# Patient Record
Sex: Male | Born: 1975 | Race: White | Hispanic: No | Marital: Single | Smoking: Current every day smoker
Health system: Southern US, Community
[De-identification: ages and names within clinical notes are randomized; demographics above are authoritative.]

## PROBLEM LIST (undated history)

## (undated) HISTORY — PX: OTHER SURGICAL HISTORY: SHX169

---

## 2013-04-27 ENCOUNTER — Emergency Department (HOSPITAL_COMMUNITY)
Admission: EM | Admit: 2013-04-27 | Discharge: 2013-04-27 | Disposition: A | Discharge status: Home / Self Care | Payer: Self-pay | Attending: Emergency Medicine | Admitting: Emergency Medicine

## 2013-04-27 ENCOUNTER — Encounter (HOSPITAL_COMMUNITY): Payer: Self-pay | Admitting: Emergency Medicine

## 2013-04-27 ENCOUNTER — Emergency Department (HOSPITAL_COMMUNITY): Payer: Self-pay

## 2013-04-27 DIAGNOSIS — F17200 Nicotine dependence, unspecified, uncomplicated: Secondary | ICD-10-CM | POA: Insufficient documentation

## 2013-04-27 DIAGNOSIS — M2550 Pain in unspecified joint: Secondary | ICD-10-CM | POA: Insufficient documentation

## 2013-04-27 DIAGNOSIS — R110 Nausea: Secondary | ICD-10-CM | POA: Insufficient documentation

## 2013-04-27 DIAGNOSIS — R52 Pain, unspecified: Secondary | ICD-10-CM | POA: Insufficient documentation

## 2013-04-27 DIAGNOSIS — J069 Acute upper respiratory infection, unspecified: Secondary | ICD-10-CM | POA: Insufficient documentation

## 2013-04-27 DIAGNOSIS — IMO0001 Reserved for inherently not codable concepts without codable children: Secondary | ICD-10-CM | POA: Insufficient documentation

## 2013-04-27 MED ORDER — IBUPROFEN 400 MG PO TABS
800.0000 mg | ORAL_TABLET | Freq: Once | ORAL | Status: AC
  Administered 2013-04-27: 800 mg via ORAL
  Filled 2013-04-27: qty 2

## 2013-04-27 NOTE — Discharge Instructions (Signed)
Rest and drink plenty of fluids  Try over the counter cough medicine and nasal decongestants to help your symptoms  Return to the ED if you have any worsening/changing condition, difficulty breathing, fever, repeated vomiting, coughing up blood, passing out, stiff neck, chest pain, or any other concerns (see below)    Upper Respiratory Infection, Adult An upper respiratory infection (URI) is also known as the common cold. It is often caused by a type of germ (virus). Colds are easily spread (contagious). You can pass it to others by kissing, coughing, sneezing, or drinking out of the same glass. Usually, you get better in 1 or 2 weeks.  HOME CARE   Only take medicine as told by your doctor.  Use a warm mist humidifier or breathe in steam from a hot shower.  Drink enough water and fluids to keep your pee (urine) clear or pale yellow.  Get plenty of rest.  Return to work when your temperature is back to normal or as told by your doctor. You may use a face mask and wash your hands to stop your cold from spreading. GET HELP RIGHT AWAY IF:   After the first few days, you feel you are getting worse.  You have questions about your medicine.  You have chills, shortness of breath, or brown or red spit (mucus).  You have yellow or brown snot (nasal discharge) or pain in the face, especially when you bend forward.  You have a fever, puffy (swollen) neck, pain when you swallow, or white spots in the back of your throat.  You have a bad headache, ear pain, sinus pain, or chest pain.  You have a high-pitched whistling sound when you breathe in and out (wheezing).  You have a lasting cough or cough up blood.  You have sore muscles or a stiff neck. MAKE SURE YOU:   Understand these instructions.  Will watch your condition.  Will get help right away if you are not doing well or get worse. Document Released: 09/24/2007 Document Revised: 06/30/2011 Document Reviewed: 08/12/2010 [facility]  Patient Information 2014 [place], [place].   Cough, Adult  A cough is a reflex that helps clear your throat and airways. It can help heal the body or may be a reaction to an irritated airway. A cough may only last 2 or 3 weeks (acute) or may last more than 8 weeks (chronic).  CAUSES Acute cough:  Viral or bacterial infections. Chronic cough:  Infections.  Allergies.  Asthma.  Post-nasal drip.  Smoking.  Heartburn or acid reflux.  Some medicines.  Chronic lung problems (COPD).  Cancer. SYMPTOMS   Cough.  Fever.  Chest pain.  Increased breathing rate.  High-pitched whistling sound when breathing (wheezing).  Colored mucus that you cough up (sputum). TREATMENT   A bacterial cough may be treated with antibiotic medicine.  A viral cough must run its course and will not respond to antibiotics.  Your caregiver may recommend other treatments if you have a chronic cough. HOME CARE INSTRUCTIONS   Only take over-the-counter or prescription medicines for pain, discomfort, or fever as directed by your caregiver. Use cough suppressants only as directed by your caregiver.  Use a cold steam vaporizer or humidifier in your bedroom or home to help loosen secretions.  Sleep in a semi-upright position if your cough is worse at night.  Rest as needed.  Stop smoking if you smoke. SEEK IMMEDIATE MEDICAL CARE IF:   You have pus in your sputum.  Your cough starts  to worsen.  You cannot control your cough with suppressants and are losing sleep.  You begin coughing up blood.  You have difficulty breathing.  You develop pain which is getting worse or is uncontrolled with medicine.  You have a fever. MAKE SURE YOU:   Understand these instructions.  Will watch your condition.  Will get help right away if you are not doing well or get worse. Document Released: 10/04/2010 Document Revised: 06/30/2011 Document Reviewed: 10/04/2010 [facility] Patient Information 2014  [place], [place].   Emergency Department Resource Guide 1) Find a Doctor and Pay Out of Pocket Although you won't have to find out who is covered by your insurance plan, it is a good idea to ask around and get recommendations. You will then need to call the office and see if the doctor you have chosen will accept you as a new patient and what types of options they offer for patients who are self-pay. Some doctors offer discounts or will set up payment plans for their patients who do not have insurance, but you will need to ask so you aren't surprised when you get to your appointment.  2) Contact Your Local Health Department Not all health departments have doctors that can see patients for sick visits, but many do, so it is worth a call to see if yours does. If you don't know where your local health department is, you can check in your phone book. The CDC also has a tool to help you locate your state's health department, and many state websites also have listings of all of their local health departments.  3) Find a Walk-in Clinic If your illness is not likely to be very severe or complicated, you may want to try a walk in clinic. These are popping up all over the country in pharmacies, drugstores, and shopping centers. They're usually staffed by nurse practitioners or physician assistants that have been trained to treat common illnesses and complaints. They're usually fairly quick and inexpensive. However, if you have serious medical issues or chronic medical problems, these are probably not your best option.  No Primary Care Doctor: - Call Health Connect at  [phone] - they can help you locate a primary care doctor that  accepts your insurance, provides certain services, etc. - Physician Referral Service- [phone]  Chronic Pain Problems: Organization         Address  Phone   Notes  [facility]  [phone] Patients need to be referred by their primary care doctor.    Medication Assistance: Organization         Address  Phone   Notes  [facility] [address] [place] [place], [place] [address] [phone] --Must be a resident of [facility] -- Must have NO insurance coverage whatsoever (no Medicaid/ Medicare, etc.) -- The pt. MUST have a primary care doctor that directs their care regularly and follows them in the community   [facility]  [phone]   [employer]  [phone]    Agencies that provide inexpensive medical care: Organization         Address  Phone   Notes  [facility]  [phone]   [facility]    [phone]   [facility] [address] [place], [place] [address] [phone]   [facility] [address] [place] [place] [phone]   Planned Parenthood    [phone])  [phone]   [facility]    [phone]   Community Health and [facility]  [address], [place] Phone:  [phone], Fax:  [phone] Hours of Operation:  9 am - 6 pm, M-F.  Also accepts Medicaid/Medicare and self-pay.  [facility]  [address], [place] Phone: [phone], Fax: [phone]. Hours of Operation:  8:30 am - 5:30 pm, M-F.  Also accepts Medicaid and self-pay.  [facility] [place] [address], [place] Phone: [phone]   [facility] [address] Natasha Bence [place], [place] [phone], Ext. [phone] Mondays & Thursdays: 7-9 AM.  First 15 patients are seen on a first come, first serve basis.    Medicaid-accepting [facility] Providers:  Organization         Address  Phone   Notes  [facility] [address], [place] [phone] Also accepts self-pay patients.  [facility] [address] Laurell Josephs [place], [place]  [phone]   [facility] [address], [place] [phone]   [facility] [address] [place] [phone]   Renaye Rakers [address], [place]   [phone] Only accepts [place] Access [place] patients after they have their name applied to their card.   Self-Pay (no insurance) in [facility]:  Organization         Address  Phone   Notes  Sickle Cell Patients, [facility] [address] [place], [place] [phone]   [facility] [address] [place], [place] [phone]   [facility]  [address], [place] [phone]   [facility]/Dr. Osei-Bonsu  [address], [place] or [address], [place] [phone] Phone number for both [place] and [place] locations is the same.  Urgent Medical and [facility] [address] [place] [phone]   [facility] [address] [place] or [address] [phone]   [facility] [address] [place] [phone], phone; [phone], fax Sees patients 1st and 3rd Saturday of every month.  Must not qualify for public or private insurance (i.e. Medicaid, Medicare, [place] Health Choice, Veterans' Benefits)  Household income should be no more than 200% of the poverty level The clinic cannot treat you if you are pregnant or think you are pregnant  Sexually transmitted diseases are not treated at the clinic.    Dental Care: Organization         Address  Phone  Notes  [facility] Department of [facility] [address] [place], [place] [phone] Accepts children up to age 62 who are enrolled in [place] or [place] Health Choice; pregnant women with a Medicaid card; and children who have applied for Medicaid or [place] Health Choice, but were declined, whose parents can pay a reduced fee at time of service.    [facility] Department of [facility]  [address], [place] [phone] Accepts children up to age 44 who are enrolled in [place] or [place] Health Choice; pregnant women with a Medicaid card; and children who have applied for Medicaid or [place] Health Choice, but were declined, whose parents can pay a  reduced fee at time of service.  [facility]  [address] [place], [place] [phone] Patients are seen by appointment only. Walk-ins are not accepted. [facility] will see patients 68 years of age and older. Monday - Tuesday (8am-5pm) Most Wednesdays (8:30-5pm) $30 per visit, cash only  [facility]  [address], [facility] [phone] Patients are seen by appointment only. Walk-ins are not accepted. [facility] will see patients 89 years of age and older. One Wednesday Evening (Monthly: Volunteer Based).  $30 per visit, cash only  [employer]  [phone] for adults; Children under age 53, call Graduate Pediatric Dentistry at [phone]. Children aged 34-14, please call [phone] to request a pediatric application.  Dental services are provided in all areas of dental care including fillings, crowns and bridges, complete and partial dentures, implants, gum treatment, root canals, and extractions. Preventive care is also provided. Treatment is provided to both adults and children. Patients are selected via a lottery and there is often a waiting list.   [facility] [address], [place]  [phone] [email or web address]   [facility] [address] [place], [place] [phone], Ext. [phone] Second and Fourth Thursday of each month, opens at 6:30 AM; Clinic ends at 9 AM.  Patients are seen on a first-come first-served basis, and a limited number are seen during each clinic.   [facility]  [address] Ether Griffins [place], [place] [phone]   Eligibility Requirements You must have lived in [place], [place], or [place] counties for at least the last three months.   You cannot be eligible for state or federal sponsored [place], including [employer], [place], or [employer].   You generally cannot be eligible for healthcare insurance through your employer.    How to apply: Eligibility screenings are held every Tuesday and Wednesday afternoon from 1:00 pm until 4:00 pm. You do not need an appointment for the interview!  [facility] [address], [place], [place] [phone]   [facility]  [phone]   [facility] Health Department  [phone]   [facility] Health Department  [phone]    Behavioral Health Resources in the Community: Intensive Outpatient Programs Organization         Address  Phone  Notes  [facility] [address] [place], [place] [phone]   [facility] Outpatient [address], [place], [place] [phone]   [facility] [address], [place], [place]  [phone]   [facility] Mental Health [address],  [place], [place] [phone] or [phone]   Substance Abuse Resources Organization         Address  Phone  Notes  [facility]  [phone]   [facility]  [phone]   [facility] [place]  [phone]   Floydene Flock  [phone]   Residential & Outpatient Substance Abuse Program  [phone]   Psychological Services Organization         Address  Phone  Notes  [facility] Health  [phone]   [facility] Services  [phone]   [facility] [address] [place] [phone] or [phone]    Mobile Crisis Teams Organization         Address  Phone  Notes  [facility], Mobile Crisis Care Unit  [phone]  [facility]  [address] [place], [place] [phone]   [facility] [address] [place] [phone]    Self-Help/Support Groups Organization         Address  Phone             Notes  [facility] [place] - variety of support groups  336- I7437963 Call for more information  Narcotics Anonymous (NA), [facility] [address], [employer] [place]  2 meetings at this location   Statistician         Address  Phone  Notes  [facility] [address],    [place]  [phone]   [facility]  [address], [place] [address], [place], [place] [phone]   [facility] [address] [place], [place] [phone] Admissions: 8am-3pm M-F  [facility] [address],    [place], [place] [phone]   [facility] [address] [place], [place], [place] [phone]   [facility] [address] [place] [address]  [place], [place] [phone]   [facility] - Intensive Outpatient [address] Laurell Josephs [address], [place], [place] [phone]   [facility] ([facility]iction [facility] [address]  [place], [place] [phone] or [phone]   [facility] ([facility]) [address], [place], [place] [phone] Accepts Medicaid  [facility] [address],  [place] [phone] Substance Abuse/Addiction Treatment   [facility] Organization         Address  Phone  Notes  [facility]  [phone]   Angie Fava, PhD [address] Ervin Knack [place], [place]   [phone] or [phone]   [facility]   [address] [place], [place] [phone]   [facility] [address] [place], [place] [phone] Insurance/Medicaid/sponsorship through [facility] and Families [address]                                    [place], [place] [phone]  Therapy/tele-psych/case  [facility] [address], [place] [phone]    Dr. Lolly Mustache  [phone]   [facility] [place]  [employer] [facility] Dept. 1) [address] [place] 2) [address], [place] 3)  [address], [place] [phone]  [phone]   [facility] Child Abuse Hotline [phone])071-3109 or [phone] (After Hours)

## 2013-04-27 NOTE — ED Notes (Signed)
Pt is here with cough, congestion, nauseated since yesterday and reports body aches

## 2013-04-27 NOTE — ED Provider Notes (Signed)
CSN: [id number]     Arrival date & time 04/27/13  1352 History  This chart was scribed for non-physician practitioner, Edward Ceo, PA-C working with Edward Churn, MD by Greggory Stallion, ED scribe. This patient was seen in room TR08C/TR08C and the patient's care was started at 3:06 PM.   Chief Complaint  Patient presents with  . Cough  . Generalized Body Aches  . Nasal Congestion   The history is provided by the patient. No language interpreter was used.   HPI Comments: Edward Reese is a 38 y.o. Male with no PMH who presents to the Emergency Department complaining of productive cough of green sputum, nasal congestion, generalized body aches and nausea that started yesterday. he has taken tylenol and a decongestant with little relief. Denies sore throat, hemoptysis, fever, abdominal pain, emesis, diarrhea, chest pain, ear pain, difficulty breathing. Denies recent travel or history of blood clots. Pt lives in a shelter and states everyone there is sick with similar symptoms.  Did not receive the flu shot this year.    History reviewed. No pertinent past medical history. Past Surgical History  Procedure Laterality Date  . Arm surgery     No family history on file. History  Substance Use Topics  . Smoking status: Current Every Day Smoker  . Smokeless tobacco: Not on file  . Alcohol Use: No    Review of Systems  Constitutional: Negative for fever.  HENT: Positive for congestion. Negative for ear pain and sore throat.   Respiratory: Positive for cough.   Cardiovascular: Negative for chest pain.  Gastrointestinal: Positive for nausea. Negative for vomiting, abdominal pain and diarrhea.  Musculoskeletal: Positive for arthralgias and myalgias.  All other systems reviewed and are negative.    Allergies  Review of patient's allergies indicates no known allergies.  Home Medications  No current outpatient prescriptions on file.  BP 136/66  Pulse 86  Temp(Src) 98.5 F (36.9 C)  (Oral)  Resp 18  Wt 154 lb 12.8 oz (70.217 kg)  SpO2 97%  Filed Vitals:   04/27/13 1357  BP: 136/66  Pulse: 86  Temp: 98.5 F (36.9 C)  TempSrc: Oral  Resp: 18  Weight: 154 lb 12.8 oz (70.217 kg)  SpO2: 97%    Physical Exam  Nursing note and vitals reviewed. Constitutional: He is oriented to person, place, and time. He appears well-developed and well-nourished. No distress.  HENT:  Head: Normocephalic and atraumatic.  Right Ear: Tympanic membrane, external ear and ear canal normal.  Left Ear: Tympanic membrane, external ear and ear canal normal.  Nose: Nose normal.  Mouth/Throat: Oropharynx is clear and moist. No oropharyngeal exudate.  Nasal congestion. Tympanic membranes gray and translucent bilaterally with no erythema, edema, or hemotympanum.  No erythema or exudates to the posterior pharynx.  Uvula midline.  No trismus  Eyes: Conjunctivae and EOM are normal. Pupils are equal, round, and reactive to light. Right eye exhibits no discharge. Left eye exhibits no discharge.  Neck: Neck supple. No tracheal deviation present.  Cardiovascular: Normal rate, regular rhythm and normal heart sounds.  Exam reveals no gallop and no friction rub.   No murmur heard. Pulmonary/Chest: Effort normal and breath sounds normal. No respiratory distress. He has no wheezes. He has no rhonchi. He has no rales.  Abdominal: Soft. He exhibits no distension. There is no tenderness. There is no rebound and no guarding.  Musculoskeletal: Normal range of motion. He exhibits no edema and no tenderness.  No pedal edema or calf  tenderness bilaterally  Neurological: He is alert and oriented to person, place, and time.  Skin: Skin is warm and dry.  Psychiatric: He has a normal mood and affect. His behavior is normal.    ED Course  Procedures (including critical care time)  DIAGNOSTIC STUDIES: Oxygen Saturation is 97% on RA, normal by my interpretation.    COORDINATION OF CARE: 3:10 PM-Discussed  treatment plan which includes ibuprofen and rest with pt at bedside and pt agreed to plan.   Labs Review Labs Reviewed - No data to display Imaging Review Dg Chest 2 View  04/27/2013   CLINICAL DATA:  Cough, congestion  EXAM: CHEST  2 VIEW  COMPARISON:  None.  FINDINGS: Mild hyperinflation with central bronchitic change. No focal pneumonia or edema. Negative for effusion or pneumothorax. Trachea midline. No osseous abnormality.  IMPRESSION: Mild hyperinflation and bronchitic change centrally. No focal pneumonia   Electronically Signed   By: Ruel Favorsrevor  Shick M.D.   On: 04/27/2013 15:02    EKG Interpretation   None           DG Chest 2 View (Final result)  Result time: 04/27/13 15:02:57    Final result by Rad Results In Interface (04/27/13 15:02:57)    Narrative:   CLINICAL DATA: Cough, congestion  EXAM: CHEST 2 VIEW  COMPARISON: None.  FINDINGS: Mild hyperinflation with central bronchitic change. No focal pneumonia or edema. Negative for effusion or pneumothorax. Trachea midline. No osseous abnormality.  IMPRESSION: Mild hyperinflation and bronchitic change centrally. No focal pneumonia   Electronically Signed By: Ruel Favorsrevor Shick M.D. On: 04/27/2013 15:02      MDM   Edward Reese is a 38 y.o. male with no PMH who presents to the Emergency Department complaining of productive cough of green sputum, nasal congestion, generalized body aches and nausea that started yesterday.  Etiology of symptoms likely due to a viral syndrome vs URI vs bronchitis.  Chest x-ray negative for an acute cardiopulmonary process, but shows bronchitis changes.  Patient afebrile, non-toxic, and vital signs are stable.  No respiratory distress or wheezing heard on exam.  No chest pain or SOB.  Patient instructed to continue OTC medications for symptomatic relief.  Encouraged to stop smoking.  Given Ibuprofen in the ED.  Return precautions, discharge instructions, and follow-up was discussed with the patient  before discharge.     Discharge Medication List as of 04/27/2013  3:17 PM      Final impressions: 1. URI (upper respiratory infection)      Luiz IronJessica Katlin Delbert Darley PA-C   I personally performed the services described in this documentation, which was scribed in my presence. The recorded information has been reviewed and is accurate.       Jillyn LedgerJessica K Sigismund Cross, PA-C 04/27/13 1549

## 2013-04-28 NOTE — ED Provider Notes (Signed)
Medical screening examination/treatment/procedure(s) were performed by non-physician practitioner and as supervising physician I was immediately available for consultation/collaboration.  EKG Interpretation   None         Candyce ChurnJohn David Skylyn Slezak, MD 04/28/13 1314

## 2013-07-30 ENCOUNTER — Encounter (HOSPITAL_COMMUNITY): Payer: Self-pay | Admitting: Emergency Medicine

## 2013-07-30 ENCOUNTER — Emergency Department (HOSPITAL_COMMUNITY)
Admission: EM | Admit: 2013-07-30 | Discharge: 2013-07-30 | Disposition: A | Discharge status: Home / Self Care | Payer: Self-pay | Attending: Emergency Medicine | Admitting: Emergency Medicine

## 2013-07-30 DIAGNOSIS — Z5900 Homelessness unspecified: Secondary | ICD-10-CM | POA: Insufficient documentation

## 2013-07-30 DIAGNOSIS — F17200 Nicotine dependence, unspecified, uncomplicated: Secondary | ICD-10-CM | POA: Insufficient documentation

## 2013-07-30 DIAGNOSIS — Y9389 Activity, other specified: Secondary | ICD-10-CM | POA: Insufficient documentation

## 2013-07-30 DIAGNOSIS — Y929 Unspecified place or not applicable: Secondary | ICD-10-CM | POA: Insufficient documentation

## 2013-07-30 DIAGNOSIS — X500XXA Overexertion from strenuous movement or load, initial encounter: Secondary | ICD-10-CM | POA: Insufficient documentation

## 2013-07-30 DIAGNOSIS — S99929A Unspecified injury of unspecified foot, initial encounter: Principal | ICD-10-CM

## 2013-07-30 DIAGNOSIS — S8990XA Unspecified injury of unspecified lower leg, initial encounter: Secondary | ICD-10-CM | POA: Insufficient documentation

## 2013-07-30 DIAGNOSIS — S99919A Unspecified injury of unspecified ankle, initial encounter: Principal | ICD-10-CM

## 2013-07-30 DIAGNOSIS — M25562 Pain in left knee: Secondary | ICD-10-CM

## 2013-07-30 MED ORDER — METHOCARBAMOL 500 MG PO TABS: 500.0000 mg | ORAL_TABLET | Freq: Three times a day (TID) | ORAL | Status: DC | PRN

## 2013-07-30 MED ORDER — IBUPROFEN 800 MG PO TABS: 800.0000 mg | ORAL_TABLET | Freq: Three times a day (TID) | ORAL | Status: DC

## 2013-07-30 NOTE — Discharge Instructions (Signed)
Read the information below.  Use the prescribed medication as directed.  Please discuss all new medications with your pharmacist.  You may return to the Emergency Department at any time for worsening condition or any new symptoms that concern you.  If you develop uncontrolled pain, weakness or numbness of the extremity, severe discoloration of the skin, or you are unable to walk, return to the ER for a recheck.      Knee Pain Knee pain can be a result of an injury or other medical conditions. Treatment will depend on the cause of your pain. HOME CARE  Only take medicine as told by your doctor.  Keep a healthy weight. Being overweight can make the knee hurt more.  Stretch before exercising or playing sports.  If there is constant knee pain, change the way you exercise. Ask your doctor for advice.  Make sure shoes fit well. Choose the right shoe for the sport or activity.  Protect your knees. Wear kneepads if needed.  Rest when you are tired. GET HELP RIGHT AWAY IF:   Your knee pain does not stop.  Your knee pain does not get better.  Your knee joint feels hot to the touch.  You have a fever. MAKE SURE YOU:   Understand these instructions.  Will watch this condition.  Will get help right away if you are not doing well or get worse. Document Released: 07/04/2008 Document Revised: 06/30/2011 Document Reviewed: 07/04/2008 [facility] Patient Information 2014 [place], [place].  Musculoskeletal Pain Musculoskeletal pain is muscle and boney aches and pains. These pains can occur in any part of the body. Your caregiver may treat you without knowing the cause of the pain. They may treat you if blood or urine tests, X-rays, and other tests were normal.  CAUSES There is often not a definite cause or reason for these pains. These pains may be caused by a type of germ (virus). The discomfort may also come from overuse. Overuse includes working out too hard when your body is not fit. Boney  aches also come from weather changes. Bone is sensitive to atmospheric pressure changes. HOME CARE INSTRUCTIONS   Ask when your test results will be ready. Make sure you get your test results.  Only take over-the-counter or prescription medicines for pain, discomfort, or fever as directed by your caregiver. If you were given medications for your condition, do not drive, operate machinery or power tools, or sign legal documents for 24 hours. Do not drink alcohol. Do not take sleeping pills or other medications that may interfere with treatment.  Continue all activities unless the activities cause more pain. When the pain lessens, slowly resume normal activities. Gradually increase the intensity and duration of the activities or exercise.  During periods of severe pain, bed rest may be helpful. Lay or sit in any position that is comfortable.  Putting ice on the injured area.  Put ice in a bag.  Place a towel between your skin and the bag.  Leave the ice on for 15 to 20 minutes, 3 to 4 times a day.  Follow up with your caregiver for continued problems and no reason can be found for the pain. If the pain becomes worse or does not go away, it may be necessary to repeat tests or do additional testing. Your caregiver may need to look further for a possible cause. SEEK IMMEDIATE MEDICAL CARE IF:  You have pain that is getting worse and is not relieved by medications.  You develop  chest pain that is associated with shortness or breath, sweating, feeling sick to your stomach (nauseous), or throw up (vomit).  Your pain becomes localized to the abdomen.  You develop any new symptoms that seem different or that concern you. MAKE SURE YOU:   Understand these instructions.  Will watch your condition.  Will get help right away if you are not doing well or get worse. Document Released: 04/07/2005 Document Revised: 06/30/2011 Document Reviewed: 12/10/2012 [facility] Patient Information 2014  [place], [place].   Emergency Department Resource Guide 1) Find a Doctor and Pay Out of Pocket Although you won't have to find out who is covered by your insurance plan, it is a good idea to ask around and get recommendations. You will then need to call the office and see if the doctor you have chosen will accept you as a new patient and what types of options they offer for patients who are self-pay. Some doctors offer discounts or will set up payment plans for their patients who do not have insurance, but you will need to ask so you aren't surprised when you get to your appointment.  2) Contact Your Local Health Department Not all health departments have doctors that can see patients for sick visits, but many do, so it is worth a call to see if yours does. If you don't know where your local health department is, you can check in your phone book. The CDC also has a tool to help you locate your state's health department, and many state websites also have listings of all of their local health departments.  3) Find a Walk-in Clinic If your illness is not likely to be very severe or complicated, you may want to try a walk in clinic. These are popping up all over the country in pharmacies, drugstores, and shopping centers. They're usually staffed by nurse practitioners or physician assistants that have been trained to treat common illnesses and complaints. They're usually fairly quick and inexpensive. However, if you have serious medical issues or chronic medical problems, these are probably not your best option.  No Primary Care Doctor: - Call Health Connect at  [phone] - they can help you locate a primary care doctor that  accepts your insurance, provides certain services, etc. - Physician Referral Service- [phone]  Chronic Pain Problems: Organization         Address  Phone   Notes  [facility]  [phone] Patients need to be referred by their primary care doctor.    Medication Assistance: Organization         Address  Phone   Notes  [facility] Medication [facility] [address] [place], [place] [address] [phone] --Must be a resident of [facility] -- Must have NO insurance coverage whatsoever (no Medicaid/ Medicare, etc.) -- The pt. MUST have a primary care doctor that directs their care regularly and follows them in the community   Med[facility]ist  [phone]   [employer]  [phone]    Agencies that provide inexpensive medical care: Organization         Address  Phone   Notes  [facility]  [phone]   [facility]    [phone]   [facility] [address] [place], [place] [address] [phone]   [facility] [address] [place], [place] [phone]   Planned Parenthood    [phone]  [facility]    [phone]   [facility]  [address], [place] Phone:  [phone], Fax:  [phone] Hours of Operation:  9 am - 6 pm, M-F.  Also accepts Medicaid/Medicare and self-pay.  [facility] for [place] [address], [address], [place] Phone: [phone], Fax: [phone]. Hours of Operation:  8:30 am - 5:30 pm, M-F.  Also accepts Medicaid and self-pay.  [facility] [place] [address], [place] Phone: [phone]   [place], [place], [place] [phone], Ext. [phone] Mondays & Thursdays: 7-9 AM.  First 15 patients are seen on a first come, first serve basis.    [place] Providers:  Organization         Address  Phone   Notes  [facility] [address], [place] [phone] Also accepts self-pay patients.  [facility] [address], [place]  [phone]   [place], [address], [place] [phone]   [facility] Medicine [address], [place] [phone]   Lucianne Lei [address], [place]   [phone] Only accepts [place] Access [place] patients after they have their name applied to their card.   Self-Pay (no insurance) in [facility]:  Organization         Address  Phone   Notes  Sickle Cell Patients, [facility] Internal Medicine [place] [phone]   [facility] Urgent Care [place] [phone]   [facility] [place]  [place], [place], [place] [phone]   [facility]/Dr. Osei-Bonsu  [address], [place] or [address], [place] [phone] Phone number for both [place] and [place] locations is the same.  [facility] [address], [place] [phone]   [facility] [address], [place] or [address] [phone]   [facility] [address], [place] [phone], phone; [phone], fax Sees patients 1st and 3rd Saturday of every month.  Must not qualify for public or private insurance (i.e. Medicaid, Medicare, [place] Health Choice, Veterans' Benefits)  Household income should be no more than 200% of the poverty level The clinic cannot treat you if you are pregnant or think you are pregnant  Sexually transmitted diseases are not treated at the clinic.    Dental Care: Organization         Address  Phone  Notes  [facility] [place] [phone] Accepts children up to age 60 who are enrolled in [place] or [place]; pregnant women with a Medicaid card; and children who have applied for Medicaid or [place] Health Choice, but were declined, whose parents can pay a reduced fee at time of service.    [facility]  [address], [place] [phone] Accepts children up to age 96 who are enrolled in [place] or [place]; pregnant women with a Medicaid card; and children who have applied for Medicaid or [place], but were declined, whose parents can pay a reduced fee at  time of service.  [place] Adult [facility] Access PROGRAM  [place] [phone] Patients are seen by appointment only. Walk-ins are not accepted. [place] will see patients 51 years of age and older. Monday - Tuesday (8am-5pm) Most Wednesdays (8:30-5pm) $30 per visit, cash only  [facility] Adult Dental Access PROGRAM  [address], [facility] [phone] Patients are seen by appointment only. Walk-ins are not accepted. [place] will see patients 49 years of age and older. One Wednesday Evening (Monthly: Volunteer Based).  $30 per visit, cash only  [place]  [phone] for adults; Children under age 11, call Graduate Pediatric Dentistry at [phone]. Children aged 11-14, please call [phone] to request a pediatric application.  Dental services are provided in all areas of dental care including fillings, crowns and bridges, complete and partial dentures, implants, gum treatment, root canals, and extractions. Preventive care is also provided. Treatment is provided to both adults and children. Patients are selected via a lottery and there is often a waiting list.   [facility] [address] [place]  [phone] [email or web address]   [facility] [address] [place], [place] [phone], Ext. [phone] Second and Fourth Thursday of each month, opens at 6:30 AM; Clinic ends at 9 AM.  Patients are seen on a first-come first-served basis, and a limited number are seen during each clinic.   [facility]  [address] [place] Danker [place], [place] [phone]   Eligibility Requirements You must have lived in [place], [place], or [place] counties for at least the last three months.   You cannot be eligible for state or federal sponsored [employer], including [employer], [place], or [employer].   You generally cannot be eligible for healthcare insurance through your employer.    How to apply: Eligibility screenings are held every Tuesday and Wednesday afternoon from 1:00 pm until 4:00 pm. You do not need an appointment for the interview!  [facility] [address], [place], [place]   [place]  [place] Department  [place]  [phone]    Behavioral Health Resources in the Community: Intensive Outpatient Programs Organization         Address  Phone  Notes  [place]. [address], [place], [place] [phone]   [facility]patient [address] [place], [place]   ADS: [facility] [address] [place], [place]   [place] [address],  [place], [place] or [phone]   Substance Abuse Resources Organization         Address  Phone  Notes  Alcohol and Drug Services  [phone]   [place]  [phone]   The Dona [facility]lemann [place]   Chinita Pester  [phone]   Residential & Outpatient Substance Abuse Program  [phone]   Psychological Services Organization         Address  Phone  Notes  [facility] Maija Biggers [place]  [place]  [phone]   [place] [address] [place] [phone] or [phone]    Mobile Crisis Teams Organization         Address  Phone  Notes  [facility], Mobile Crisis Care Unit  [phone]   Assertive Psychotherapeutic  Services  [address] [place], [place]   [facility] [address], [place] [phone]    Self-Help/Support Groups Organization         Address  Phone             Notes  [place]. of [place] - variety of support groups  [place] Call for more information  Narcotics Anonymous (NA), [facility] [address], [employer] [place]  2 meetings at this location   Special educational needs teacher         Address  Phone  Notes  [facility] Residential Treatment [place],    [place]  [phone]   [facility]  [address] [place] [phone], [place], [place]   [place], [place] [phone] Admissions: 8am-3pm M-F  Incentives Substance [place] [address],    [place], [place] [phone]   [facility] [address] [place], [place], [place]   [facility] [address]  [place], [place]   [facility] Programs - Intensive Outpatient [address], [place], [place]   [facility] ([place].,  [place], [place] [phone] or [phone]   [facility] ([facility]) [address], [place], [place] Accepts Medicaid  [facility] [place] [address],  [place] [phone] Substance Abuse/Addiction Treatment   [facility] Organization         Address  Phone  Notes  [facility]  [phone]   Domenic Schwab, PhD [address] [place], [place]   [phone] or [phone]   [place] [facility] [place], [place] [phone]   [facility] [phone], [place], [place] [phone] Insurance/Medicaid/sponsorship through [facility] [address], [address]                                    [place], [place] [phone]  [place] [address], [place] [phone]    Dr. Adele Schilder  [phone]   [facility] Dept. 1) [address]  2) [place] 3)  [address], [place] [phone]  [phone]   [place] [phone] or [phone] (After Hours)
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
## 2013-07-30 NOTE — ED Notes (Signed)
Provider at the bedside.  
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
## 2013-08-03 NOTE — ED Provider Notes (Signed)
Medical screening examination/treatment/procedure(s) were performed by non-physician practitioner and as supervising physician I was immediately available for consultation/collaboration.   EKG Interpretation None      Devoria AlbeIva Satara Virella, MD, Armando GangFACEP   Ward GivensIva L Ammarie Matsuura, MD 08/03/13 1258

## 2013-09-06 ENCOUNTER — Encounter (HOSPITAL_COMMUNITY): Payer: Self-pay | Admitting: Emergency Medicine

## 2013-09-06 ENCOUNTER — Emergency Department (HOSPITAL_COMMUNITY)
Admission: EM | Admit: 2013-09-06 | Discharge: 2013-09-06 | Disposition: A | Discharge status: Home / Self Care | Payer: Self-pay | Attending: Emergency Medicine | Admitting: Emergency Medicine

## 2013-09-06 DIAGNOSIS — F17200 Nicotine dependence, unspecified, uncomplicated: Secondary | ICD-10-CM | POA: Insufficient documentation

## 2013-09-06 DIAGNOSIS — Z79899 Other long term (current) drug therapy: Secondary | ICD-10-CM | POA: Insufficient documentation

## 2013-09-06 DIAGNOSIS — K029 Dental caries, unspecified: Secondary | ICD-10-CM | POA: Insufficient documentation

## 2013-09-06 DIAGNOSIS — H109 Unspecified conjunctivitis: Secondary | ICD-10-CM | POA: Insufficient documentation

## 2013-09-06 MED ORDER — HYDROCODONE-ACETAMINOPHEN 5-325 MG PO TABS: 1.0000 | ORAL_TABLET | ORAL | Status: DC | PRN

## 2013-09-06 MED ORDER — TOBRAMYCIN 0.3 % OP SOLN
2.0000 [drp] | OPHTHALMIC | Status: DC
  Administered 2013-09-06: 2 [drp] via OPHTHALMIC
  Filled 2013-09-06: qty 5

## 2013-09-06 MED ORDER — PENICILLIN V POTASSIUM 500 MG PO TABS: 500.0000 mg | ORAL_TABLET | Freq: Three times a day (TID) | ORAL | Status: DC

## 2013-09-06 NOTE — Discharge Instructions (Signed)
Conjunctivitis Conjunctivitis is commonly called "pink eye." Conjunctivitis can be caused by bacterial or viral infection, allergies, or injuries. There is usually redness of the lining of the eye, itching, discomfort, and sometimes discharge. There may be deposits of matter along the eyelids. A viral infection usually causes a watery discharge, while a bacterial infection causes a yellowish, thick discharge. Pink eye is very contagious and spreads by direct contact. You may be given antibiotic eyedrops as part of your treatment. Before using your eye medicine, remove all drainage from the eye by washing gently with warm water and cotton balls. Continue to use the medication until you have awakened 2 mornings in a row without discharge from the eye. Do not rub your eye. This increases the irritation and helps spread infection. Use separate towels from other household members. Wash your hands with soap and water before and after touching your eyes. Use cold compresses to reduce pain and sunglasses to relieve irritation from light. Do not wear contact lenses or wear eye makeup until the infection is gone. SEEK MEDICAL CARE IF:   Your symptoms are not better after 3 days of treatment.  You have increased pain or trouble seeing.  The outer eyelids become very red or swollen. Document Released: 05/15/2004 Document Revised: 06/30/2011 Document Reviewed: 04/07/2005 [facility] Patient Information 2014 [employer], [place].  Dental Care and Dentist Visits Dental care supports good overall health. Regular dental visits can also help you avoid dental pain, bleeding, infection, and other more serious health problems in the future. It is important to keep the mouth healthy because diseases in the teeth, gums, and other oral tissues can spread to other areas of the body. Some problems, such as diabetes, heart disease, and pre-term labor have been associated with poor oral health.  See your dentist every 6 months. If you  experience emergency problems such as a toothache or broken tooth, go to the dentist right away. If you see your dentist regularly, you may catch problems early. It is easier to be treated for problems in the early stages.  WHAT TO EXPECT AT A DENTIST VISIT  Your dentist will look for many common oral health problems and recommend proper treatment. At your regular dental visit, you can expect:  Gentle cleaning of the teeth and gums. This includes scraping and polishing. This helps to remove the sticky substance around the teeth and gums (plaque). Plaque forms in the mouth shortly after eating. Over time, plaque hardens on the teeth as tartar. If tartar is not removed regularly, it can cause problems. Cleaning also helps remove stains.  Periodic X-rays. These pictures of the teeth and supporting bone will help your dentist assess the health of your teeth.  Periodic fluoride treatments. Fluoride is a natural mineral shown to help strengthen teeth. Fluoride treatmentinvolves applying a fluoride gel or varnish to the teeth. It is most commonly done in children.  Examination of the mouth, tongue, jaws, teeth, and gums to look for any oral health problems, such as:  Cavities (dental caries). This is decay on the tooth caused by plaque, sugar, and acid in the mouth. It is best to catch a cavity when it is small.  Inflammation of the gums caused by plaque buildup (gingivitis).  Problems with the mouth or malformed or misaligned teeth.  Oral cancer or other diseases of the soft tissues or jaws. KEEP YOUR TEETH AND GUMS HEALTHY For healthy teeth and gums, follow these general guidelines as well as your dentist's specific advice:  Have  your teeth professionally cleaned at the dentist every 6 months.  Brush twice daily with a fluoride toothpaste.  Floss your teeth daily.  Ask your dentist if you need fluoride supplements, treatments, or fluoride toothpaste.  Eat a healthy diet. Reduce foods and  drinks with added sugar.  Avoid smoking. TREATMENT FOR ORAL HEALTH PROBLEMS If you have oral health problems, treatment varies depending on the conditions present in your teeth and gums.  Your caregiver will most likely recommend good oral hygiene at each visit.  For cavities, gingivitis, or other oral health disease, your caregiver will perform a procedure to treat the problem. This is typically done at a separate appointment. Sometimes your caregiver will refer you to another dental specialist for specific tooth problems or for surgery. SEEK IMMEDIATE DENTAL CARE IF:  You have pain, bleeding, or soreness in the gum, tooth, jaw, or mouth area.  A permanent tooth becomes loose or separated from the gum socket.  You experience a blow or injury to the mouth or jaw area. Document Released: 12/18/2010 Document Revised: 06/30/2011 Document Reviewed: 12/18/2010 [facility] Patient Information 2014 [employer], [place].

## 2013-09-06 NOTE — ED Notes (Signed)
Pt. reports left lower molar pain onset yesterday .

## 2013-09-06 NOTE — ED Provider Notes (Signed)
CSN: [id number]     Arrival date & time 09/06/13  [id number] History  This chart was scribed for non-physician practitioner Elpidio AnisShari Santhiago Collingsworth PA-C working with Junius ArgyleForrest S Harrison, MD by Danella Maiersaroline Early, ED Scribe. This patient was seen in room TR10C/TR10C and the patient's care was started at 7:46 PM.    Chief Complaint  Patient presents with  . Dental Pain   The history is provided by the patient. No language interpreter was used.   HPI Comments: Edward Reese is a 38 y.o. male who presents to the Emergency Department complaining of gradually-worsening left lower dental pain onset yesterday. He denies associated facial swelling. He reports trouble with the same tooth in the past.   He also reports pain, redness, and drainage from the right eye onset 2 days ago. He states the eye is crusted shut when he wakes up in the morning. He denies problems in the left eye.    History reviewed. No pertinent past medical history. Past Surgical History  Procedure Laterality Date  . Arm surgery     No family history on file. History  Substance Use Topics  . Smoking status: Current Every Day Smoker  . Smokeless tobacco: Not on file  . Alcohol Use: No    Review of Systems  Constitutional: Negative for fever.  HENT: Positive for dental problem.   Eyes: Positive for pain, discharge and redness.  Gastrointestinal: Negative for vomiting.  All other systems reviewed and are negative.     Allergies  Review of patient's allergies indicates no known allergies.  Home Medications   Prior to Admission medications   Medication Sig Start Date End Date Taking? Authorizing Provider  ibuprofen (ADVIL,MOTRIN) 800 MG tablet Take 1 tablet (800 mg total) by mouth 3 (three) times daily. 07/30/13   Trixie DredgeEmily West, PA-C  methocarbamol (ROBAXIN) 500 MG tablet Take 1 tablet (500 mg total) by mouth every 8 (eight) hours as needed for muscle spasms (or pain). 07/30/13   Trixie DredgeEmily West, PA-C   BP 114/68  Pulse 65  Temp(Src) 98.8 F  (37.1 C) (Oral)  Resp 18  Ht 5\' 7"  (1.702 m)  Wt 146 lb (66.225 kg)  BMI 22.86 kg/m2  SpO2 100% Physical Exam  Nursing note and vitals reviewed. Constitutional: He is oriented to person, place, and time. He appears well-developed and well-nourished. No distress.  HENT:  Head: Normocephalic and atraumatic.  Partially edentulous. Significant caries to number 19 and is tender to touch. No facial swelling  Eyes: EOM are normal.  Right eye has conjunctival erythema, minimal swelling, no purulent discharge. Cornea clear he has a pain free EOMI. Eyelids are within the normal limits.   Neck: Neck supple. No tracheal deviation present.  Cardiovascular: Normal rate.   Pulmonary/Chest: Effort normal. No respiratory distress.  Musculoskeletal: Normal range of motion.  Neurological: He is alert and oriented to person, place, and time.  Skin: Skin is warm and dry.  Psychiatric: He has a normal mood and affect. His behavior is normal.    ED Course  Procedures (including critical care time) Medications - No data to display  DIAGNOSTIC STUDIES: Oxygen Saturation is 100% on RA, normal by my interpretation.    COORDINATION OF CARE: 8:15 PM- Discussed treatment plan with pt. Pt agrees to plan.    Labs Review Labs Reviewed - No data to display  Imaging Review No results found.   EKG Interpretation None      MDM   Final diagnoses:  None    1.  Dental pain 2. Conjunctivitis  Appropriate medications prescribed. Uncomplicated conditions - stable for discharge.   I personally performed the services described in this documentation, which was scribed in my presence. The recorded information has been reviewed and is accurate.     Arnoldo HookerShari A Netta Fodge, PA-C 09/11/13 1637

## 2013-09-06 NOTE — ED Notes (Signed)
Onset days right eye swollen, red, watery eye discharge and at times white mucus discharge, matted upon awakening.  Denies itching.  Onset last night lower back molar pain.

## 2013-09-11 NOTE — ED Provider Notes (Signed)
Medical screening examination/treatment/procedure(s) were performed by non-physician practitioner and as supervising physician I was immediately available for consultation/collaboration.   EKG Interpretation None        Junius Argyle, MD 09/11/13 2326

## 2013-10-28 ENCOUNTER — Emergency Department (HOSPITAL_COMMUNITY)
Admission: EM | Admit: 2013-10-28 | Discharge: 2013-10-28 | Disposition: A | Discharge status: Home / Self Care | Payer: Self-pay | Attending: Emergency Medicine | Admitting: Emergency Medicine

## 2013-10-28 ENCOUNTER — Encounter (HOSPITAL_COMMUNITY): Payer: Self-pay | Admitting: Emergency Medicine

## 2013-10-28 DIAGNOSIS — Z716 Tobacco abuse counseling: Secondary | ICD-10-CM

## 2013-10-28 DIAGNOSIS — K047 Periapical abscess without sinus: Secondary | ICD-10-CM | POA: Insufficient documentation

## 2013-10-28 DIAGNOSIS — K029 Dental caries, unspecified: Secondary | ICD-10-CM | POA: Insufficient documentation

## 2013-10-28 DIAGNOSIS — Z7189 Other specified counseling: Secondary | ICD-10-CM | POA: Insufficient documentation

## 2013-10-28 DIAGNOSIS — F17200 Nicotine dependence, unspecified, uncomplicated: Secondary | ICD-10-CM | POA: Insufficient documentation

## 2013-10-28 DIAGNOSIS — K05219 Aggressive periodontitis, localized, unspecified severity: Secondary | ICD-10-CM

## 2013-10-28 MED ORDER — OXYCODONE-ACETAMINOPHEN 5-325 MG PO TABS
1.0000 | ORAL_TABLET | Freq: Once | ORAL | Status: AC
  Administered 2013-10-28: 1 via ORAL
  Filled 2013-10-28: qty 1

## 2013-10-28 MED ORDER — AMOXICILLIN 500 MG PO CAPS: 500.0000 mg | ORAL_CAPSULE | Freq: Three times a day (TID) | ORAL | Status: DC

## 2013-10-28 MED ORDER — HYDROCODONE-ACETAMINOPHEN 5-325 MG PO TABS: 1.0000 | ORAL_TABLET | ORAL | Status: DC | PRN

## 2013-10-28 MED ORDER — AMOXICILLIN 250 MG PO CAPS
750.0000 mg | ORAL_CAPSULE | Freq: Three times a day (TID) | ORAL | Status: DC
Start: 2013-10-28 — End: 2013-10-28
  Administered 2013-10-28: 750 mg via ORAL
  Filled 2013-10-28 (×4): qty 3

## 2013-10-28 MED ORDER — IBUPROFEN 600 MG PO TABS: 600.0000 mg | ORAL_TABLET | Freq: Four times a day (QID) | ORAL | Status: DC | PRN

## 2013-10-28 NOTE — ED Provider Notes (Signed)
CSN: [id number]     Arrival date & time 10/28/13  0112 History   First MD Initiated Contact with Patient 10/28/13 [phone]     Chief Complaint  Patient presents with  . Dental Pain     (Consider location/radiation/quality/duration/timing/severity/associated sxs/prior Treatment) HPI  This patient is a 38 year old man who is in generally good health. He is one pack per day smoker.  He presents with complaints of pain in the left lower molar area x 24 hrs. His pain was aching, nonradiating and 10/10 when he arrived to the ED. He received Percocet and has pain has improved to 4/10. Pain is worse with exposure to hot and cold substances. The patient has not seen a dentist. He denies fever and facial swelling.    History reviewed. No pertinent past medical history. Past Surgical History  Procedure Laterality Date  . Arm surgery     No family history on file. History  Substance Use Topics  . Smoking status: Current Every Day Smoker    Types: Cigarettes  . Smokeless tobacco: Not on file  . Alcohol Use: No    Review of Systems  Limited ROS obtained based on focused assessment of complaint. No fever. No lymphadenopathy, per patient. NO trouble swallowing. Po intake wnl. No trouble breathing.    Allergies  Sulfa antibiotics  Home Medications   Prior to Admission medications   Not on File   BP 112/68  Pulse 64  Temp(Src) 98.1 F (36.7 C) (Oral)  Resp 16  Ht 5\' 7"  (1.702 m)  Wt 150 lb (68.04 kg)  BMI 23.49 kg/m2  SpO2 100% Physical Exam Gen: well developed and well nourished appearing Head: NCAT Eyes: PERL, EOMI Nose: no epistaixis or rhinorrhea Mouth/throat:  Fracture and decay with tenderness to percussion of tooth #18, diffuse decay and ttp over tooth #19. No superfiical periodontal abcess identified. Uvula is midline, no peritonsillar edema or abscess. No trismus Neck: supple, no stridor, no adenopathy Lungs: CTA B, no wheezing, rhonchi or rales CV: RRR, no murmur,  extremities appear well perfused.  Abd: soft Back: normal to inspection Skin: warm and dry Ext: normal to inspection, no dependent edema Neuro: CN ii-xii grossly intact, no focal deficits Psyche; normal affect,  calm and cooperative.   ED Course  Procedures (including critical care time) Labs Review  Dental caries with suspected periapical abscess. We will tx with Amoxicillin along with Vicodin and Ibuprofen for pain management and refer to dental resource list for outpatient f/u. I have discussed return precautions with the patient.   MDM   Periodontal abscess Tobacco abuse Dental caries.     Brandt LoosenJulie Manly, MD 10/28/13 [phone]

## 2013-10-28 NOTE — Discharge Instructions (Signed)
°Emergency Department Resource Guide °1) Find a Doctor and Pay Out of Pocket °Although you won't have to find out who is covered by your insurance plan, it is a good idea to ask around and get recommendations. You will then need to call the office and see if the doctor you have chosen will accept you as a new patient and what types of options they offer for patients who are self-pay. Some doctors offer discounts or will set up payment plans for their patients who do not have insurance, but you will need to ask so you aren't surprised when you get to your appointment. ° °2) Contact Your Local Health Department °Not all health departments have doctors that can see patients for sick visits, but many do, so it is worth a call to see if yours does. If you don't know where your local health department is, you can check in your phone book. The CDC also has a tool to help you locate your state's health department, and many state websites also have listings of all of their local health departments. ° °3) Find a Walk-in Clinic °If your illness is not likely to be very severe or complicated, you may want to try a walk in clinic. These are popping up all over the country in pharmacies, drugstores, and shopping centers. They're usually staffed by nurse practitioners or physician assistants that have been trained to treat common illnesses and complaints. They're usually fairly quick and inexpensive. However, if you have serious medical issues or chronic medical problems, these are probably not your best option. ° °No Primary Care Doctor: °- Call [facility] at  832-8000 - they can help you locate a primary care doctor that  accepts your insurance, provides certain services, etc. °- Physician Referral Service- 1-800-533-3463 ° °Chronic Pain Problems: °Organization         Address  Phone   Notes  °[facility]  (336) 297-2271 Patients need to be referred by their primary care doctor.  ° °Medication  Assistance: °Organization         Address  Phone   Notes  °[place] [facility] [address] °[place], [place] [address] (336) 641-8030 --Must be a resident of [place] °-- Must have NO insurance coverage whatsoever (no Medicaid/ Medicare, etc.) °-- The pt. MUST have a primary care doctor that directs their care regularly and follows them in the community °  °[facility]  (866) 331-1348   °[employer]  (888) 892-1162   ° °Agencies that provide inexpensive medical care: °Organization         Address  Phone   Notes  °[facility]  (336) 832-8035   °[facility]    (336) 832-7272   °[facility] [address] °[place], [place] [address] (336) 832-4777   °[facility] [address], °[place] (336) 271-4999   °[facility]    (336) 373-0678   °[facility]    (336) 272-1050   °[facility] ° [address], [place] Phone:  (336) 832-4444, Fax:  (336) 832-4440 Hours of Operation:  9 am - 6 pm, M-F.  Also accepts Medicaid/Medicare and self-pay.  °[facility] ° [address], [place] Phone: (336) 832-3150, Fax: (336) 832-3151. Hours of Operation:  8:30 am - 5:30 pm, M-F.  Also accepts Medicaid and self-pay.  °[facility] [place] [address], [place] Phone: (336) 878-6027   °[facility] [address], [place], [place] (336)723-1848, Ext. 123 Mondays & Thursdays: 7-9 AM.  First 15 patients are seen on a first come, first serve basis. °  ° °Medicaid-accepting [place] Providers: ° °Organization         Address  Phone   Notes  °[facility] [address], [place] (336) 641-2100 Also accepts self-pay patients.  °[facility] [address], [place] ° (336) 856-9996   °[facility] [address], [place]  (336) 288-8857   °[facility] [address] [place] [address], [place] (336) 299-7000   °Veita Bland [address], [place]  ° (336) 373-1557 Only accepts [place] Access Medicaid patients after they have their name applied to their card.  ° °Self-Pay (no insurance) in [place]: ° °Organization         Address  Phone   Notes  °Sickle Cell Patients, [facility] [address], [place] (336) 832-1970   °[facility] [address], [place] (336) 832-4400   °[facility] ° [address], [place] (336) 992-4800   °[facility]/Dr. Osei-Bonsu ° [address] [place] [address], [place] or [address], [place] (336) 841-8500 Phone number for both [place] and [place] locations is the same.  °[facility] [address], [place] (336) 299-0000   °[facility] [address] [place] [address], [place] or [address] (336) 852-7530 °(336) 878-2260   °[facility] [address], [place] (336) 350-1642, phone; (336) 294-5005, fax Sees patients 1st and 3rd Saturday of every month.  Must not qualify for public or private insurance (i.e. Medicaid, Medicare, [place] Health Choice, Veterans' Benefits) • Household income should be no more than 200% of the poverty level •The clinic cannot treat you if you are pregnant or think you are pregnant • Sexually transmitted diseases are not treated at the clinic.  ° ° °Dental Care: °Organization         Address  Phone  Notes  °[facility] [facility] [address], [place] (336) 641-6152 Accepts children up to age 21 who are enrolled in Medicaid or [place] Health Choice; pregnant women with a Medicaid card; and children who have applied for Medicaid or [place] Health Choice, but were declined, whose parents can pay a reduced fee at time of service.  °[place]  [facility] [place]  [address], [place] (336) 641-7733 Accepts children up to age 21 who are enrolled in Medicaid or [place] Health Choice; pregnant women with a Medicaid card; and children who have applied for Medicaid or [place] Health Choice, but were declined, whose parents can pay a reduced fee at time of service.  °[facility] ° [address],  (336) 641-4533 Patients are seen by appointment only. Walk-ins are not accepted. [facility] will see patients 18 years of age and older. °Monday - Tuesday (8am-5pm) °Most Wednesdays (8:30-5pm) °$30 per visit, cash only  °[facility] ° [address], [place] (336) 641-4533 Patients are seen by appointment only. Walk-ins are not accepted. [facility] will see patients 18 years of age and older. °One   Wednesday Evening (Monthly: Volunteer Based).  $30 per visit, cash only  °[facility]  (919) 537-3737 for adults; Children under age 4, call Graduate Pediatric Dentistry at ([phone]) [phone]. Children aged 4-14, please call ([phone]) [phone] to request a pediatric application. ° Dental services are provided in all areas of dental care including fillings, crowns and bridges, complete and partial dentures, implants, gum treatment, root canals, and extractions. Preventive care is also provided. Treatment is provided to both adults and children. °Patients are selected via a lottery and there is often a waiting list. °  °[facility] [address], °[place] ° (336) 763-8833 [email or web address] °  °[facility] [address], [place], [place] (336)723-1848, Ext. 123 Second and Fourth Thursday of each month, opens at 6:30 AM; Clinic ends at 9 AM.  Patients are seen on a first-come first-served basis, and a limited number are seen during each clinic.  ° °[facility] ° [address], [place], [place] (336) 723-7904    Eligibility Requirements °You must have lived in [place], [place], or [place] counties for at least the last three months. °  You cannot be eligible for state or federal sponsored healthcare insurance, including Veterans Administration, Medicaid, or Medicare. °  You generally cannot be eligible for healthcare insurance through your employer.  °  How to apply: °Eligibility screenings are held every Tuesday and Wednesday afternoon from 1:00 pm until 4:00 pm. You do not need an appointment for the interview!  °[facility] [address], [place], [place] 336-631-2330   °[facility]  336-342-8273   °[facility]  336-703-3100   °[facility]  336-570-6415   ° °Behavioral Health Resources in the Community: °Intensive Outpatient Programs °Organization         Address  Phone  Notes  °[facility] [address], [place], [place] 336-878-6098   °[facility] [address], [place], [place] 336-832-9800   °[facility] [address], [place], [place] ° 336-882-2125   °[facility] [address],  °[place], [place] 1-800-853-5163 or 336-641-4981   °Substance Abuse Resources °Organization         Address  Phone  Notes  °[facility]  336-882-2125   °[facility]  336-784-9470   °[facility]  336-285-9073   °[facility]  336-845-3988   °Residential & Outpatient Substance Abuse Program  1-800-659-3381   °Psychological Services °Organization         Address  Phone  Notes  °[facility]  336- 832-9600   °[facility]  336- 378-7881   °[facility] [address], [place] 1-800-853-5163 or 336-641-4981   ° °Mobile Crisis Teams °Organization         Address  Phone  Notes  °[facility], Mobile Crisis Care Unit  1-877-626-1772   °Assertive °[facility] ° [address]  [place], [place] 336-834-9664   °Sharon DeEsch [address] °[place] 336-554-5454   ° °Self-Help/Support Groups °Organization         Address  Phone             Notes  °[facility] - variety of support groups  336- 373-1402 Call for more information  °Narcotics Anonymous (NA), [facility] [address], °[place]   2 meetings at this location  ° °  Residential Treatment Programs °Organization         Address  Phone  Notes  °[facility] [address],    °[place]  1-866-801-8205   °[facility] ° [address], [place], [place] 704-293-8524   °[facility] [address], [place] 336-845-3988 Admissions: 8am-3pm M-F  °[facility] [address]    °[place], [place] 336-841-1104   °[facility] [address], [place], [place] 336-379-7146   °[facility] [address]  °[place], [place] 336-285-9073   °[facility] Intensive Outpatient [address], [place], [place] 336-852-3033   °[facility] [address]  °[place], [place] 1-877-615-2722 or 336-784-9470   °[facility] [address] , [place] 336-227-7417 Accepts Medicaid  °[facility] [address]  °[place] 1-800-659-3381 Substance Abuse/Addiction Treatment  ° °[place] Behavioral Health Resources °Organization         Address  Phone  Notes  °[facility]  (888) 581-9988   °Ashar Lewinski Brannon, PhD [address] [place], [place]   (336) 349-5553 or (336) 951-0000   °[facility]   [address] °[place], [place] (336) 349-4454   °[facility] [address], [place], [place] (336) 342-8316 Insurance/Medicaid/sponsorship through [facility]  °[facility] [address]                                    [place], [place] (336) 342-8316 Therapy/tele-psych/case    °[facility] [address]  ° [place], [place] (336) 349-2233    °Dr. Arfeen  (336) 349-4544   °[facility] [place]  [employer] [place] [facility] 1) [address], [place] °2) [address], [place] °3)  [address], [place] (336) 349-3220 °(336) 342-7768 ° °(336) 342-8140   °[place] [facility] (336) 342-1394 or (336) 342-3537 (After Hours)    ° ° °

## 2013-10-28 NOTE — ED Notes (Signed)
Pt report dental pain for several days in his Left Lower Jaw. Pt reports the tooth has been broken fir quite some time, and that it may be infected. Pt Ax4, NAD.

## 2013-10-28 NOTE — ED Notes (Signed)
Patient ambulated to nursing station asking when provider will be there.  Explained delay, and that they will be in soon.

## 2014-01-03 ENCOUNTER — Encounter (HOSPITAL_COMMUNITY): Payer: Self-pay | Admitting: Emergency Medicine

## 2014-01-03 ENCOUNTER — Emergency Department (HOSPITAL_COMMUNITY)
Admission: EM | Admit: 2014-01-03 | Discharge: 2014-01-04 | Disposition: A | Discharge status: Home / Self Care | Payer: Self-pay | Attending: Emergency Medicine | Admitting: Emergency Medicine

## 2014-01-03 DIAGNOSIS — F17200 Nicotine dependence, unspecified, uncomplicated: Secondary | ICD-10-CM | POA: Insufficient documentation

## 2014-01-03 DIAGNOSIS — K029 Dental caries, unspecified: Secondary | ICD-10-CM | POA: Insufficient documentation

## 2014-01-03 DIAGNOSIS — K0889 Other specified disorders of teeth and supporting structures: Secondary | ICD-10-CM

## 2014-01-03 DIAGNOSIS — K089 Disorder of teeth and supporting structures, unspecified: Secondary | ICD-10-CM | POA: Insufficient documentation

## 2014-01-03 NOTE — ED Notes (Signed)
Pt states he started having dental pain last night. Has several teeth he needs pulled he states. Alert and oriented.

## 2014-01-04 MED ORDER — NAPROXEN 500 MG PO TABS: 500.0000 mg | ORAL_TABLET | Freq: Two times a day (BID) | ORAL | Status: AC

## 2014-01-04 MED ORDER — HYDROCODONE-ACETAMINOPHEN 5-325 MG PO TABS
2.0000 | ORAL_TABLET | Freq: Once | ORAL | Status: AC
  Administered 2014-01-04: 2 via ORAL
  Filled 2014-01-04: qty 2

## 2014-01-04 MED ORDER — PENICILLIN V POTASSIUM 500 MG PO TABS: 500.0000 mg | ORAL_TABLET | Freq: Four times a day (QID) | ORAL | Status: AC

## 2014-01-04 MED ORDER — HYDROCODONE-ACETAMINOPHEN 5-325 MG PO TABS: 1.0000 | ORAL_TABLET | Freq: Four times a day (QID) | ORAL | Status: AC | PRN

## 2014-01-04 NOTE — Discharge Instructions (Signed)
Take penicillin to cover for infection. Take naproxen for pain control. You may take Norco as needed for severe pain. Follow up with a dentist as soon as possible for further evaluation of symptoms.  Dental Pain A tooth ache may be caused by cavities (tooth decay). Cavities expose the nerve of the tooth to air and hot or cold temperatures. It may come from an infection or abscess (also called a boil or furuncle) around your tooth. It is also often caused by dental caries (tooth decay). This causes the pain you are having. DIAGNOSIS  Your caregiver can diagnose this problem by exam. TREATMENT   If caused by an infection, it may be treated with medications which kill germs (antibiotics) and pain medications as prescribed by your caregiver. Take medications as directed.  Only take over-the-counter or prescription medicines for pain, discomfort, or fever as directed by your caregiver.  Whether the tooth ache today is caused by infection or dental disease, you should see your dentist as soon as possible for further care. SEEK MEDICAL CARE IF: The exam and treatment you received today has been provided on an emergency basis only. This is not a substitute for complete medical or dental care. If your problem worsens or new problems (symptoms) appear, and you are unable to meet with your dentist, call or return to this location. SEEK IMMEDIATE MEDICAL CARE IF:   You have a fever.  You develop redness and swelling of your face, jaw, or neck.  You are unable to open your mouth.  You have severe pain uncontrolled by pain medicine. MAKE SURE YOU:   Understand these instructions.  Will watch your condition.  Will get help right away if you are not doing well or get worse. Document Released: 04/07/2005 Document Revised: 06/30/2011 Document Reviewed: 11/24/2007 [facility] Patient Information 2015 [place]. This information is not intended to replace advice given to you by your health care  provider. Make sure you discuss any questions you have with your health care provider.  Emergency Department Resource Guide 1) Find a Doctor and Pay Out of Pocket Although you won't have to find out who is covered by your insurance plan, it is a good idea to ask around and get recommendations. You will then need to call the office and see if the doctor you have chosen will accept you as a new patient and what types of options they offer for patients who are self-pay. Some doctors offer discounts or will set up payment plans for their patients who do not have insurance, but you will need to ask so you aren't surprised when you get to your appointment.  2) Contact Your Local Health Department Not all health departments have doctors that can see patients for sick visits, but many do, so it is worth a call to see if yours does. If you don't know where your local health department is, you can check in your phone book. The CDC also has a tool to help you locate your state's health department, and many state websites also have listings of all of their local health departments.  3) Find a Walk-in Clinic If your illness is not likely to be very severe or complicated, you may want to try a walk in clinic. These are popping up all over the country in pharmacies, drugstores, and shopping centers. They're usually staffed by nurse practitioners or physician assistants that have been trained to treat common illnesses and complaints. They're usually fairly quick and inexpensive. However, if you  have serious medical issues or chronic medical problems, these are probably not your best option.  No Primary Care Doctor: - Call Health Connect at  [phone] - they can help you locate a primary care doctor that  accepts your insurance, provides certain services, etc. - Physician Referral Service- [phone]  Chronic Pain Problems: Organization         Address  Phone   Notes  [facility]  [phone] Patients need to be referred by their primary care doctor.   Medication Assistance: Organization         Address  Phone   Notes  [facility] Medication [place] [address] [place], [place] [address] [phone] --Must be a resident of [facility] -- Must have NO insurance coverage whatsoever (no Medicaid/ Medicare, etc.) -- The pt. MUST have a primary care doctor that directs their care regularly and follows them in the community   [facility]  [phone]   [employer]  [phone]    Agencies that provide inexpensive medical care: Organization         Address  Phone   Notes  [facility]  [phone]   [facility]    [phone]   [facility] [address] [place], [place] [address] [phone]   [facility] [address] [place], [place] [phone]   Planned Parenthood    [phone]   [facility]    [phone]   [facility]  [address], [place] Phone:  [phone], Fax:  [phone] Hours of Operation:  9 am - 6 pm, M-F.  Also accepts Medicaid/Medicare and self-pay.  [facility]  [address], [place] Phone: [phone], Fax: [phone]. Hours of Operation:  8:30 am - 5:30 pm, M-F.  Also accepts Medicaid and self-pay.  [facility] [place] [address], [place] Phone: [phone]   [facility] [address] Natasha Bence [place], [place] [phone], Ext. [phone] Mondays & Thursdays: 7-9 AM.  First 15 patients are seen on a first come, first serve basis.    Medicaid-accepting [facility] Providers:  Organization         Address  Phone   Notes  [facility] [address], [place] [phone] Also accepts self-pay patients.  [facility] [address] Laurell Josephs [place], [place]   [phone]   [facility] [address], [place] [phone]   [facility] [address], [place] [phone]   Renaye Rakers [address], [place]   [phone] Only accepts [place] Access [place] patients after they have their name applied to their card.   Self-Pay (no insurance) in [facility]:  Organization         Address  Phone   Notes  Sickle Cell Patients, [facility] Internal Medicine [address] [place], [place] [phone]   [facility] Urgent Care [address] [place], [place] [phone]   [facility] [place]  [address], [place] [phone]   [facility]/Dr. Osei-Bonsu  [address], [place] or [address], [place] [phone] Phone number for both [employer] and [place]  locations is the same.  Urgent Medical and [facility] [address], [place] [phone]   [facility] [address], [place] or [address] [phone]   [facility] [address], [place] [phone], phone; [phone], fax Sees patients 1st and 3rd Saturday of every month.  Must not qualify for public or private insurance (i.e. Medicaid, Medicare, [place] Health Choice, Veterans' Benefits)  Household income should be no more than 200% of the poverty level The clinic cannot treat you if you are pregnant or think you are pregnant  Sexually transmitted diseases are not treated at the clinic.    Dental Care: Organization         Address  Phone  Notes  [facility] Department of [facility] [address] [place], [place] [phone] Accepts children up to age 96 who are enrolled in [place] or [place] Health Choice; pregnant women with a Medicaid card; and children who have applied for Medicaid or [place] Health Choice, but  were declined, whose parents can pay a reduced fee at time of service.  [facility] Department of [facility]  [address], [place] [phone] Accepts children up to age 56 who are enrolled in [place] or [place] Health Choice; pregnant women with a Medicaid card; and children who have applied for Medicaid or [place] Health Choice, but were declined, whose parents can pay a reduced fee at time of service.  [facility]  [address] [place], [place] [phone] Patients are seen by appointment only. Walk-ins are not accepted. [facility] will see patients 42 years of age and older. Monday - Tuesday (8am-5pm) Most Wednesdays (8:30-5pm) $30 per visit, cash only  [facility] Adult Dental Access PRO[facility]  [address], [facility] [phone] Patients are seen by appointment only. Walk-ins are not accepted. [facility] will see patients 12 years of age and older. One Wednesday Evening (Monthly: Volunteer Based).  $30 per visit, cash only  [employer]  [phone] for adults; Children under age 94, call Graduate Pediatric Dentistry at [phone]. Children aged 76-14, please call [phone] to request a pediatric application.  Dental services are provided in all areas of dental care including fillings, crowns and bridges, complete and partial dentures, implants, gum treatment, root canals, and extractions. Preventive care is also provided. Treatment is provided to both adults and children. Patients are selected via a lottery and there is often a waiting list.   [facility] [address], [place]  [phone] [email or web address]   [facility] [address] [place], [place] [phone], Ext. [phone] Second and Fourth Thursday of each month, opens at 6:30 AM; Clinic ends at 9 AM.  Patients are seen on a first-come first-served basis, and a limited number are seen during each clinic.    [facility]  [address] Ether [place], [place] [phone]   Eligibility Requirements You must have lived in [place], [place], or [place] counties for at least the last three months.   You cannot be eligible for state or federal sponsored [place], including [employer], [place], or [employer].   You generally cannot be eligible for healthcare insurance through your employer.    How to apply: Eligibility screenings are held every Tuesday and Wednesday afternoon from 1:00 pm until 4:00 pm. You do not need  an appointment for the interview!  [facility] [address], [place], [place] [phone]   [facility] Health Department  [phone]   [facility] Health Department  [phone]   [facility] Health Department  [phone]    Behavioral Health Resources in the Community: Intensive Outpatient Programs Organization         Address  Phone  Notes  [facility] [address], [place], [place] [phone]   [facility] Outpatient [address], [place], [place] [phone]   [facility] [address] [place], [place]  [phone]   [facility] Mental Health [address],  [place], [place] [phone] or [phone]   Substance Abuse Resources Organization         Address  Phone  Notes  [facility]  [phone]   [facility]  [phone]   [facility] [place]  [phone]   Floydene Flock  [phone]   Residential & Outpatient Substance Abuse Program  [phone]   Psychological Services Organization         Address  Phone  Notes  [facility] Behavioral [facility]  [phone]   [facility] Services  [phone]   [facility] Mental Health [address], [place] [phone] or [phone]    Mobile Crisis Teams Organization         Address  Phone  Notes  [facility], Mobile Crisis Care  Unit  [phone]   [facility]  [address] [place], [place] [phone]   Doristine Locks [address] [place] [phone]    Self-Help/Support Groups Organization         Address  Phone             Notes  [facility] [place] - variety of support groups  336- I7437963 Call for more information  Narcotics Anonymous (NA), [facility] [address], [employer] [place]  2 meetings at this location   Statistician         Address  Phone  Notes  [facility] [address],    [place]  [phone]   [facility]  [address] [place] [address], [place], [place] [phone]   [facility] [address] [place], [place] [phone] Admissions: 8am-3pm M-F  [facility] [address]    [place], [place] [phone]   [facility] [address] [place], [place], [place] [phone]   [facility] [address],  [place], [place] [phone]   [facility] - Intensive Outpatient [address] Laurell Joseph[address], [place], [place] [phone]   [facility] ([facility] [address] [place]  [place], [place] [phone] or [phone]   [facility] ([facility]) [address], [place], [place] [phone] Accepts Medicaid  [facility] [address]  [place] [phone] Substance Abuse/Addiction Treatment   [facility] Organization         Address  Phone  Notes  [facility]  [phone]   Angie Fava, PhD [address] Ervin Knack [place], [place]   [phone] or [phone]   [facility] Behavioral   [address] [place], [place] [phone]   [facility] [address] [place], [place] [phone] Insurance/Medicaid/sponsorship through [employer] and Families [address]  [place], [place] [phone] [place], [place] [phone]    Dr. Adele Schilder  [phone]   [facility] Dept. 1) [address] [place] 2) [place] 3)  [address], [place] [phone]  [phone]   [place] [phone] or [phone] (After Hours)

## 2014-01-04 NOTE — ED Notes (Signed)
Call patient name twice no answer. 

## 2014-01-04 NOTE — ED Provider Notes (Signed)
CSN: [id number]     Arrival date & time 01/03/14  2245 History   First MD Initiated Contact with Patient 01/04/14 0012     Chief Complaint  Patient presents with  . Dental Pain     (Consider location/radiation/quality/duration/timing/severity/associated sxs/prior Treatment) Patient is a 38 y.o. male presenting with tooth pain. The history is provided by the patient. No language interpreter was used.  Dental Pain Location:  Upper and lower Upper teeth location:  14/LU 1st molar Lower teeth location:  18/LL 2nd molar Quality:  Aching and sharp Severity:  Moderate Onset quality:  Gradual Duration:  1 day Timing:  Constant Progression:  Waxing and waning Chronicity:  New Context: dental caries and poor dentition   Context: not abscess and not trauma   Relieved by:  Nothing Worsened by:  Touching and pressure Ineffective treatments:  NSAIDs Associated symptoms: no difficulty swallowing, no drooling, no facial swelling, no fever, no gum swelling, no neck pain, no neck swelling, no oral bleeding, no oral lesions and no trismus   Risk factors: lack of dental care, periodontal disease and smoking     History reviewed. No pertinent past medical history. Past Surgical History  Procedure Laterality Date  . Arm surgery     History reviewed. No pertinent family history. History  Substance Use Topics  . Smoking status: Current Every Day Smoker    Types: Cigarettes  . Smokeless tobacco: Not on file  . Alcohol Use: No    Review of Systems  Constitutional: Negative for fever.  HENT: Positive for dental problem. Negative for drooling, facial swelling and mouth sores.   Musculoskeletal: Negative for neck pain.  All other systems reviewed and are negative.   Allergies  Sulfa antibiotics  Home Medications   Prior to Admission medications   Medication Sig Start Date End Date Taking? Authorizing Provider  HYDROcodone-acetaminophen (NORCO/VICODIN) 5-325 MG per tablet Take 1-2  tablets by mouth every 6 (six) hours as needed for moderate pain or severe pain. 01/04/14   Antony Madura, PA-C  naproxen (NAPROSYN) 500 MG tablet Take 1 tablet (500 mg total) by mouth 2 (two) times daily. 01/04/14   Antony Madura, PA-C  penicillin v potassium (VEETID) 500 MG tablet Take 1 tablet (500 mg total) by mouth 4 (four) times daily. 01/04/14 01/11/14  Antony Madura, PA-C   BP 122/62  Pulse 73  Temp(Src) 98.2 F (36.8 C) (Oral)  Resp 16  SpO2 100%  Physical Exam  Nursing note and vitals reviewed. Constitutional: He is oriented to person, place, and time. He appears well-developed and well-nourished. No distress.  Nontoxic/nonseptic appearing  HENT:  Head: Normocephalic and atraumatic.  Mouth/Throat: Uvula is midline, oropharynx is clear and moist and mucous membranes are normal. No oral lesions. No trismus in the jaw. Abnormal dentition. Dental caries present. No dental abscesses or uvula swelling.    Uvula midline. No trismus. Patient tolerating secretions without difficulty or drooling. No gingival swelling or fluctuance. No oral bleeding or lesions.  Eyes: Conjunctivae and EOM are normal. No scleral icterus.  Neck: Normal range of motion.  Pulmonary/Chest: Effort normal. No respiratory distress.  Chest expansion symmetric  Musculoskeletal: Normal range of motion.  Neurological: He is alert and oriented to person, place, and time.  Skin: Skin is warm and dry. No rash noted. He is not diaphoretic. No erythema. No pallor.  Psychiatric: He has a normal mood and affect. His behavior is normal.    ED Course  Procedures (including critical care time) Labs Review  Labs Reviewed - No data to display  Imaging Review No results found.   EKG Interpretation None      MDM   Final diagnoses:  Dentalgia  Dental caries    Patient with LU and LL toothache x 1 day. No gross abscess. Exam unconcerning for Ludwig's angina or spread of infection. Will treat with penicillin and pain  medicine. Urged patient to follow-up with dentist. Resource guide and information on free dental clinic provided. Patient agreeable to plan with no unaddressed concerns. Patient discharged in good condition; VSS.   Filed Vitals:   01/03/14 2329  BP: 122/62  Pulse: 73  Temp: 98.2 F (36.8 C)  TempSrc: Oral  Resp: 16  SpO2: 100%        Antony Madura, PA-C 01/04/14 [phone]

## 2014-01-05 NOTE — ED Provider Notes (Signed)
Medical screening examination/treatment/procedure(s) were performed by non-physician practitioner and as supervising physician I was immediately available for consultation/collaboration.   EKG Interpretation None        David H Yao, MD 01/05/14 2119 

## 2014-08-07 IMAGING — CR DG CHEST 2V
2 series · 2 of 2 positions shown · non-contrast
Comparison: None.

CLINICAL DATA: Cough, congestion

EXAM:
CHEST  2 VIEW

[w chest pa]
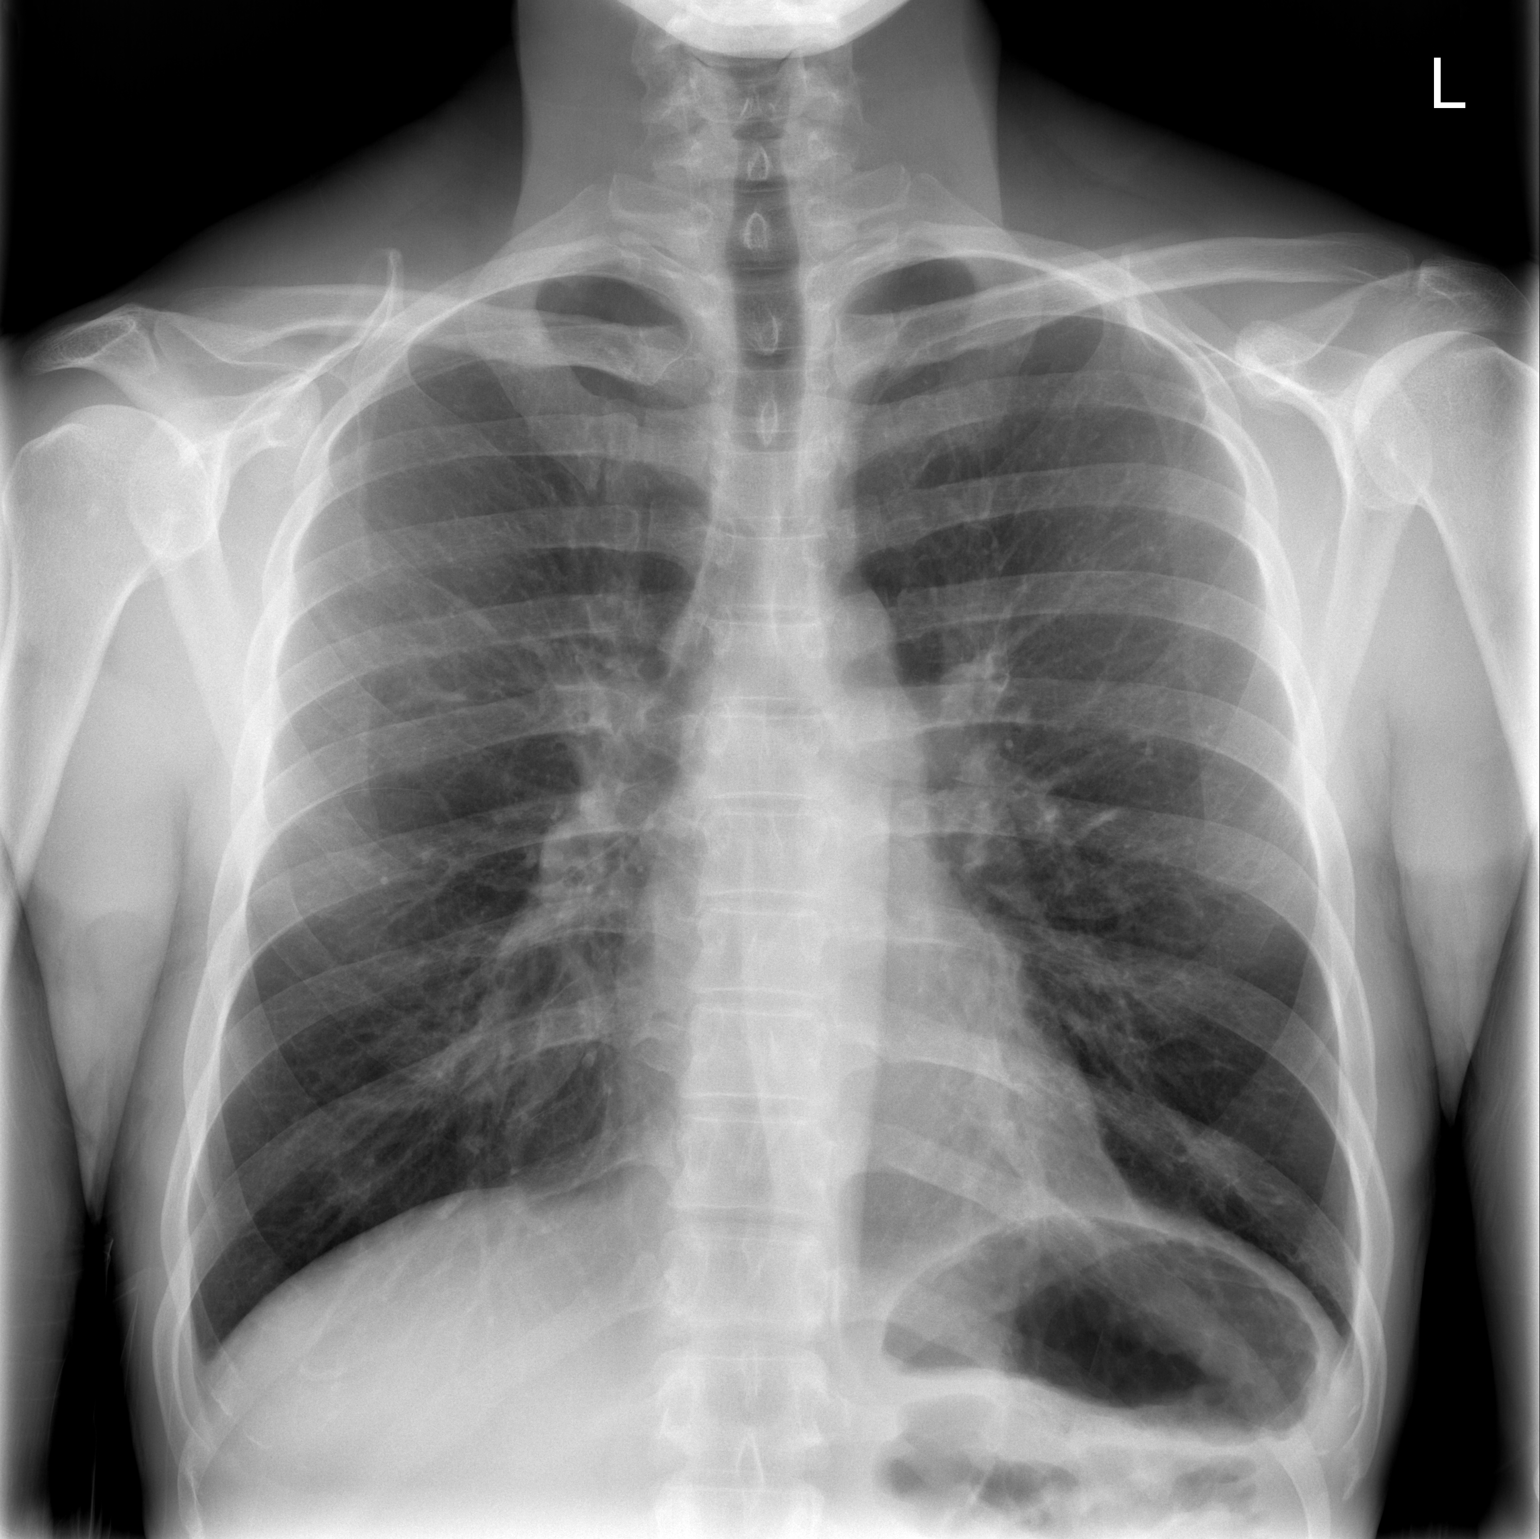

[w chest lat]
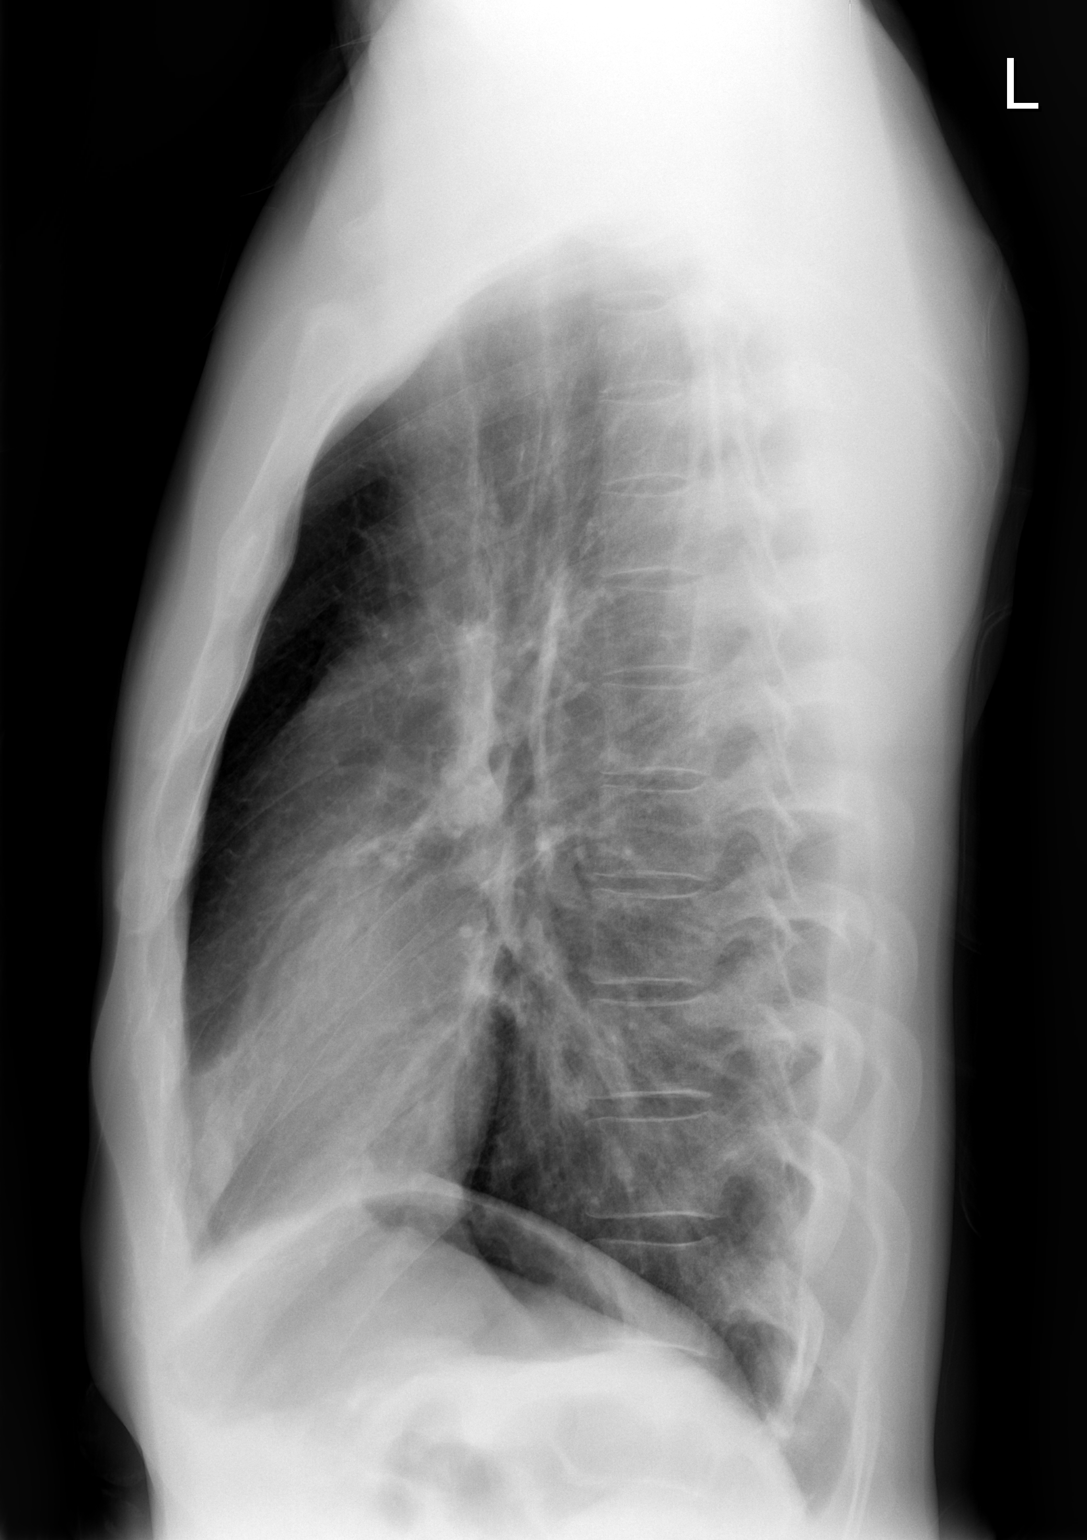

[2 of 2 positions shown; findings below may reference images not displayed]

FINDINGS: Mild hyperinflation with central bronchitic change. No focal
pneumonia or edema. Negative for effusion or pneumothorax. Trachea
midline. No osseous abnormality.
IMPRESSION: Mild hyperinflation and bronchitic change centrally. No focal
pneumonia
